# Patient Record
Sex: Female | Born: 1980 | Race: Black or African American | Hispanic: No | Marital: Single | Smoking: Current every day smoker
Health system: Southern US, Community
[De-identification: ages and names within clinical notes are randomized; demographics above are authoritative.]

## PROBLEM LIST (undated history)

## (undated) HISTORY — PX: TUBAL LIGATION: SHX77

---

## 2014-09-10 ENCOUNTER — Emergency Department (HOSPITAL_COMMUNITY): Payer: Medicaid Other

## 2014-09-10 ENCOUNTER — Encounter (HOSPITAL_COMMUNITY): Payer: Self-pay | Admitting: Emergency Medicine

## 2014-09-10 ENCOUNTER — Emergency Department (HOSPITAL_COMMUNITY)
Admission: EM | Admit: 2014-09-10 | Discharge: 2014-09-10 | Disposition: A | Discharge status: Home / Self Care | Payer: Medicaid Other | Attending: Emergency Medicine | Admitting: Emergency Medicine

## 2014-09-10 DIAGNOSIS — S99911A Unspecified injury of right ankle, initial encounter: Secondary | ICD-10-CM | POA: Diagnosis present

## 2014-09-10 DIAGNOSIS — Y998 Other external cause status: Secondary | ICD-10-CM | POA: Insufficient documentation

## 2014-09-10 DIAGNOSIS — Y9289 Other specified places as the place of occurrence of the external cause: Secondary | ICD-10-CM | POA: Insufficient documentation

## 2014-09-10 DIAGNOSIS — S93401A Sprain of unspecified ligament of right ankle, initial encounter: Secondary | ICD-10-CM | POA: Insufficient documentation

## 2014-09-10 DIAGNOSIS — Y9389 Activity, other specified: Secondary | ICD-10-CM | POA: Diagnosis not present

## 2014-09-10 DIAGNOSIS — X58XXXA Exposure to other specified factors, initial encounter: Secondary | ICD-10-CM | POA: Insufficient documentation

## 2014-09-10 DIAGNOSIS — Z720 Tobacco use: Secondary | ICD-10-CM | POA: Diagnosis not present

## 2014-09-10 MED ORDER — IBUPROFEN 600 MG PO TABS: 600.0000 mg | ORAL_TABLET | Freq: Four times a day (QID) | ORAL | Status: AC | PRN

## 2014-09-10 NOTE — ED Notes (Signed)
Twisted right ankle last pm. No deformity.

## 2014-09-10 NOTE — Discharge Instructions (Signed)
Acute Ankle Sprain with Phase II Rehab An acute ankle sprain is a partial or complete tear in one or more of the ligaments of the ankle due to traumatic injury. The severity of the injury depends on both the number of ligaments sprained and the grade of sprain. There are 3 grades of sprains.  A grade 1 sprain is a mild sprain. There is a slight pull without obvious tearing. There is no loss of strength, and the muscle and ligament are the correct length.  A grade 2 sprain is a moderate sprain. There is tearing of fibers within the substance of the ligament where it connects two bones or two cartilages. The length of the ligament is increased, and there is usually decreased strength.  A grade 3 sprain is a complete rupture of the ligament and is uncommon. In addition to the grade of sprain, there are 3 types of ankle sprains.  Lateral ankle sprains. This is a sprain of one or more of the 3 ligaments on the outer side (lateral) of the ankle. These are the most common sprains. Medial ankle sprains. There is one large triangular ligament on the inner side (medial) of the ankle that is susceptible to injury. Medial ankle sprains are less common. Syndesmosis, "high ankle," sprains. The syndesmosis is the ligament that connects the two bones of the lower leg. Syndesmosis sprains usually only occur with very severe ankle sprains. SYMPTOMS  Pain, tenderness, and swelling in the ankle, starting at the side of injury that may progress to the whole ankle and foot with time.  "Pop" or tearing sensation at the time of injury.  Bruising that may spread to the heel.  Impaired ability to walk soon after injury. CAUSES   Acute ankle sprains are caused by trauma placed on the ankle that temporarily forces or pries the anklebone (talus) out of its normal socket.  Stretching or tearing of the ligaments that normally hold the joint in place (usually due to a twisting injury). RISK INCREASES WITH:  Previous  ankle sprain.  Sports in which the foot may land awkwardly (basketball, volleyball, soccer) or walking or running on uneven or rough surfaces.  Shoes with inadequate support to prevent sideways motion when stress occurs.  Poor strength and flexibility.  Poor balance skills.  Contact sports. PREVENTION  Warm up and stretch properly before activity.  Maintain physical fitness:  Ankle and leg flexibility, muscle strength, and endurance.  Cardiovascular fitness.  Balance training activities.  Use proper technique and have a coach correct improper technique.  Taping, protective strapping, bracing, or high-top tennis shoes may help prevent injury. Initially, tape is best. However, it loses most of its support function within 10 to 15 minutes.  Wear proper fitted protective shoes. Combining high-top shoes with taping or bracing is more effective than using either alone.  Provide the ankle with support during sports and practice activities for 12 months following injury. PROGNOSIS   If treated properly, ankle sprains can be expected to recover completely. However, the length of recovery depends on the degree of injury.  A grade 1 sprain usually heals enough in 5 to 7 days to allow modified activity and requires an average of 6 weeks to heal completely.  A grade 2 sprain requires 6 to 10 weeks to heal completely.  A grade 3 sprain requires 12 to 16 weeks to heal.  A syndesmosis sprain often takes more than 3 months to heal. RELATED COMPLICATIONS   Frequent recurrence of symptoms may result   in a chronic problem. Appropriately addressing the problem the first time decreases the frequency of recurrence and optimizes healing time. Severity of initial sprain does not predict the likelihood of later instability.  Injury to other structures (bone, cartilage, or tendon).  Chronically unstable or arthritic ankle joint are possible with repeated sprains. TREATMENT Treatment initially  involves the use of ice, medicine, and compression bandages to help reduce pain and inflammation. Ankle sprains are usually immobilized in a walking cast or boot to allow for healing. Crutches may be recommended to reduce pressure on the injury. After immobilization, strengthening and stretching exercises may be necessary to regain strength and a full range of motion. Surgery is rarely needed to treat ankle sprains. MEDICATION   Nonsteroidal anti-inflammatory medicines, such as aspirin and ibuprofen (do not take for the first 3 days after injury or within 7 days before surgery), or other minor pain relievers, such as acetaminophen, are often recommended. Take these as directed by your caregiver. Contact your caregiver immediately if any bleeding, stomach upset, or signs of an allergic reaction occur from these medicines.  Ointments applied to the skin may be helpful.  Pain relievers may be prescribed as necessary by your caregiver. Do not take prescription pain medicine for longer than 4 to 7 days. Use only as directed and only as much as you need. HEAT AND COLD  Cold treatment (icing) is used to relieve pain and reduce inflammation for acute and chronic cases. Cold should be applied for 10 to 15 minutes every 2 to 3 hours for inflammation and pain and immediately after any activity that aggravates your symptoms. Use ice packs or an ice massage.  Heat treatment may be used before performing stretching and strengthening activities prescribed by your caregiver. Use a heat pack or a warm soak. SEEK IMMEDIATE MEDICAL CARE IF:   Pain, swelling, or bruising worsens despite treatment.  You experience pain, numbness, discoloration, or coldness in the foot or toes.  New, unexplained symptoms develop. (Drugs used in treatment may produce side effects.) EXERCISES  PHASE II EXERCISES RANGE OF MOTION (ROM) AND STRETCHING EXERCISES - Ankle Sprain, Acute-Phase II, Weeks 3 to 4 After your physician, physical  therapist, or athletic trainer feels your knee has made progress significant enough to begin more advanced exercises, he or she may recommend completing some of the following exercises. Although each person heals at different rates, most people will be ready for these exercises between 3 and 4 weeks after their injury. Do not begin these exercises until you have your caregiver's permission. He or she may also advise you to continue with the exercises which you completed in Phase I of your rehabilitation. While completing these exercises, remember:   Restoring tissue flexibility helps normal motion to return to the joints. This allows healthier, less painful movement and activity.  An effective stretch should be held for at least 30 seconds.  A stretch should never be painful. You should only feel a gentle lengthening or release in the stretched tissue. RANGE OF MOTION - Ankle Plantar Flexion   Sit with your right / left leg crossed over your opposite knee.  Use your opposite hand to pull the top of your foot and toes toward you.  You should feel a gentle stretch on the top of your foot/ankle. Hold this position for __________. Repeat __________ times. Complete __________ times per day.  RANGE OF MOTION - Ankle Eversion  Sit with your right / left ankle crossed over your opposite knee.    Grip your foot with your opposite hand, placing your thumb on the top of your foot and your fingers across the bottom of your foot.  Gently push your foot downward with a slight rotation so your littlest toes rise slightly  You should feel a gentle stretch on the inside of your ankle. Hold the stretch for __________ seconds. Repeat __________ times. Complete this exercise __________ times per day.  RANGE OF MOTION - Ankle Inversion  Sit with your right / left ankle crossed over your opposite knee.  Grip your foot with your opposite hand, placing your thumb on the bottom of your foot and your fingers across  the top of your foot.  Gently pull your foot so the smallest toe comes toward you and your thumb pushes the inside of the ball of your foot away from you.  You should feel a gentle stretch on the outside of your ankle. Hold the stretch for __________ seconds. Repeat __________ times. Complete this exercise __________ times per day.  STRETCH - Gastrocsoleus  Sit with your right / left leg extended. Holding onto both ends of a belt or towel, loop it around the ball of your foot.  Keeping your right / left ankle and foot relaxed and your knee straight, pull your foot and ankle toward you using the belt/towel.  You should feel a gentle stretch behind your calf or knee. Hold this position for __________ seconds. Repeat __________ times. Complete this stretch __________ times per day.  RANGE OF MOTION - Ankle Dorsiflexion, Active Assisted  Remove shoes and sit on a chair that is preferably not on a carpeted surface.  Place right / left foot under knee. Extend your opposite leg for support.  Keeping your heel down, slide your right / left foot back toward the chair until you feel a stretch at your ankle or calf. If you do not feel a stretch, slide your bottom forward to the edge of the chair while still keeping your heel down.  Hold this stretch for __________ seconds. Repeat __________ times. Complete this stretch __________ times per day.  STRETCH - Gastroc, Standing   Place hands on wall.  Extend right / left leg and place a folded washcloth under the arch of your foot for support. Keep the front knee somewhat bent.  Slightly point your toes inward on your back foot.  Keeping your right / left heel on the floor and your knee straight, shift your weight toward the wall, not allowing your back to arch.  You should feel a gentle stretch in the calf. Hold this position for __________ seconds. Repeat __________ times. Complete this stretch __________ times per day. STRETCH - Soleus,  Standing  Place hands on wall.  Extend right / left leg and place a folded washcloth under the arch of your foot for support. Keep the front knee somewhat bent.  Slightly point your toes inward on your back foot.  Keep your right / left heel on the floor, bend your back knee, and slightly shift your weight over the back leg so that you feel a gentle stretch deep in your back calf.  Hold this position for __________ seconds. Repeat __________ times. Complete this stretch __________ times per day. STRETCH - Gastrocsoleus, Standing Note: This exercise can place a lot of stress on your foot and ankle. Please complete this exercise only if specifically instructed by your caregiver.   Place the ball of your right / left foot on a step, keeping your other   foot firmly on the same step.  Hold on to the wall or a rail for balance.  Slowly lift your other foot, allowing your body weight to press your heel down over the edge of the step.  You should feel a stretch in your right / left calf.  Hold this position for __________ seconds.  Repeat this exercise with a slight bend in your knee. Repeat __________ times. Complete this stretch __________ times per day.  STRENGTHENING EXERCISES - Ankle Sprain, Acute-Phase II Around 3 to 4 weeks after your injury, you may progress to some of these exercises in your rehabilitation program. Do not begin these until you have your caregiver's permission. Although your condition has improved, the Phase I exercises will continue to be helpful and you may continue to complete them. As you complete strengthening exercises, remember:   Strong muscles with good endurance tolerate stress better.  Do the exercises as initially prescribed by your caregiver. Progress slowly with each exercise, gradually increasing the number of repetitions and weight used under his or her guidance.  You may experience muscle soreness or fatigue, but the pain or discomfort you are trying  to eliminate should never worsen during these exercises. If this pain does worsen, stop and make certain you are following the directions exactly. If the pain is still present after adjustments, discontinue the exercise until you can discuss the trouble with your caregiver. STRENGTH - Plantar-flexors, Standing  Stand with your feet shoulder width apart. Steady yourself with a wall or table using as little support as needed.  Keeping your weight evenly spread over the width of your feet, rise up on your toes.*  Hold this position for __________ seconds. Repeat __________ times. Complete this exercise __________ times per day.  *If this is too easy, shift your weight toward your right / left leg until you feel challenged. Ultimately, you may be asked to do this exercise with your right / left foot only. STRENGTH - Dorsiflexors and Plantar-flexors, Heel/toe Walking  Dorsiflexion: Walk on your heels only. Keep your toes as high as possible.  Walk for ____________________ seconds/feet.  Repeat __________ times. Complete __________ times per day.  Plantar flexion: Walk on your toes only. Keep your heels as high as possible.  Walk for ____________________ seconds/feet. Repeat __________ times. Complete __________ times per day.  BALANCE - Tandem Walking  Place your uninjured foot on a line 2 to 4 inches wide and at least 10 feet long.  Keeping your balance without using anything for extra support, place your right / left heel directly in front of your other foot.  Slowly raise your back foot up, lifting from the heel to the toes, and place it directly in front of the right / left foot.  Continue to walk along the line slowly. Walk for ____________________ feet. Repeat ____________________ times. Complete ____________________ times per day. BALANCE - Inversion/Eversion Use caution, these are advanced level exercises. Do not begin them until you are advised to do so.   Create a balance  board using a sturdy board about 1  feet long and at 1 to 1  feet wide and a 1  inch diameter rod or pipe that is as long as the board's width. A copper pipe or a solid broomstick work well.  Stand on a non-carpeted surface near a countertop or wall. Step onto the board so that your feet are hip-width apart and equally straddle the rod/pipe.  Keeping your feet in place, complete these two exercises   without shifting your upper body or hips:  Tip the board from side-to-side. Control the movement so the board does not forcefully strike the ground. The board should silently tap the ground.  Tip the board side-to-side without striking the ground. Occasionally pause and maintain a steady position at various points.  Repeat the first two exercises, but use only your right / left foot. Place your right / left foot directly over the rod/pipe. Repeat __________ times. Complete this exercise __________ times a day. BALANCE - Plantar/Dorsi Flexion Use caution, these are advanced level exercises. Do not begin them until you are advised to do so.   Create a balance board using a sturdy board about 1  feet long and at 1 to 1  feet wide and a 1  inch diameter rod or pipe that is as long as the board's width. A copper pipe or a solid broomstick work well.  Stand on a non-carpeted surface near a countertop or wall. Stand on the board so that the rod/pipe runs under the arches in your feet.  Keeping your feet in place, complete these two exercises without shifting your upper body or hips:  Tip the board from side-to-side. Control the movement so the board does not forcefully strike the ground. The board should silently tap the ground.  Tip the board side-to-side without striking the ground. Occasionally pause and maintain a steady position at various points.  Repeat the first two exercises, but use only your right / left foot. Stand in the center of the board. Repeat __________ times. Complete this  exercise __________ times a day. STRENGTH - Plantar-flexors, Eccentric Note: This exercise can place a lot of stress on your foot and ankle. Please complete this exercise only if specifically instructed by your caregiver.   Place the balls of your feet on a step. With your hands, use only enough support from a wall or rail to keep your balance.  Keep your knees straight and rise up on your toes.  Slowly shift your weight entirely to your toes and pick up your opposite foot. Gently and with controlled movement, lower your weight through your right / left foot so that your heel drops below the level of the step. You will feel a slight stretch in the back of your calf at the ending position.  Use the healthy leg to help rise up onto the balls of both feet, then lower weight only on the right / left leg again. Build up to 15 repetitions. Then progress to 3 consecutive sets of 15 repetitions.*  After completing the above exercise, complete the same exercise with a slight knee bend (about 30 degrees). Again, build up to 15 repetitions. Then progress to 3 consecutive sets of 15 repetitions.* Perform this exercise __________ times per day.  *When you easily complete 3 sets of 15, your physician, physical therapist, or athletic trainer may advise you to add resistance by wearing a backpack filled with additional weight. Document Released: 05/21/2005 Document Revised: 04/23/2011 Document Reviewed: 05/13/2008 [employer] Patient Information 2015 [employer]. This information is not intended to replace advice given to you by your health care provider. Make sure you discuss any questions you have with your health care provider.  

## 2014-09-10 NOTE — Progress Notes (Signed)
Orthopedic Tech Progress Note Patient Details:  Mia Joyce 12-15-1980 [id number]  Ortho Devices Type of Ortho Device: ASO, Crutches Ortho Device/Splint Location: rle Ortho Device/Splint Interventions: Application   Lauranne Beyersdorf 09/10/2014, 10:16 AM

## 2014-09-10 NOTE — ED Provider Notes (Signed)
CSN: [id number]     Arrival date & time 09/10/14  0905 History  This chart was scribed for non-physician practitioner, Roxy Horseman, PA-C, working with Rolland Porter, MD by Charline Bills, ED Scribe. This patient was seen in room TR07C/TR07C and the patient's care was started at 9:55 AM.   Chief Complaint  Patient presents with  . Ankle Pain   The history is provided by the patient. No language interpreter was used.   HPI Comments: Mia Joyce is a 34 y.o. female who presents to the Emergency Department with a chief complaint of right ankle injury sustained last night. Pt states that she missed a step and rolled her ankle last night. She reports a constant, throbbing sensation that is exacerbated with bearing weight and palpation. Pt is able to ambulate with a limp. She has applied ice to the area without significant relief.   History reviewed. No pertinent past medical history. Past Surgical History  Procedure Laterality Date  . Tubal ligation     No family history on file. History  Substance Use Topics  . Smoking status: Current Every Day Smoker  . Smokeless tobacco: Not on file  . Alcohol Use: Yes   OB History    No data available     Review of Systems  Constitutional: Negative for fever and chills.  Respiratory: Negative for shortness of breath.   Cardiovascular: Negative for chest pain.  Gastrointestinal: Negative for nausea, vomiting, diarrhea and constipation.  Genitourinary: Negative for dysuria.  Musculoskeletal: Positive for arthralgias.   Allergies  Review of patient's allergies indicates no known allergies.  Home Medications   Prior to Admission medications   Not on File   BP 119/65 mmHg  Pulse 103  Temp(Src) 98.1 F (36.7 C) (Oral)  Resp 18  Ht 5\' 4"  (1.626 m)  Wt 156 lb (70.761 kg)  BMI 26.76 kg/m2  SpO2 95% Physical Exam  Constitutional: She is oriented to person, place, and time. She appears well-developed and well-nourished. No distress.  HENT:   Head: Normocephalic and atraumatic.  Eyes: Conjunctivae and EOM are normal.  Neck: Neck supple. No tracheal deviation present.  Cardiovascular: Normal rate and intact distal pulses.   Good cap refill.   Pulmonary/Chest: Effort normal. No respiratory distress.  Musculoskeletal: Normal range of motion.  R ankle: Moderate Tenderness to palpation over the R ATFL and CFL. No bony abnormality or defomity. ROM and strength 5/5. Ambulates with mild antalgic gait.   Neurological: She is alert and oriented to person, place, and time. No sensory deficit.  Sensation intact.   Skin: Skin is warm and dry.  Psychiatric: She has a normal mood and affect. Her behavior is normal.  Nursing note and vitals reviewed.  ED Course  Procedures (including critical care time) DIAGNOSTIC STUDIES: Oxygen Saturation is 95% on RA, normal by my interpretation.    COORDINATION OF CARE: 9:58 AM-Discussed treatment plan which includes XR, ankle brace and follow-up with ortho if needed with pt at bedside and pt agreed to plan.   Labs Review Labs Reviewed - No data to display  Imaging Review Dg Ankle Complete Right  09/10/2014   CLINICAL DATA:  Right ankle twisting injury last night with persistent anterior pain  EXAM: RIGHT ANKLE - COMPLETE 3+ VIEW  COMPARISON:  None in PACs  FINDINGS: The ankle joint mortise is preserved. The talar dome is intact. There is no acute malleolar fracture. The talus and calcaneus are unremarkable. Mild soft tissue swelling anteriorly is noted.  IMPRESSION:  There is no acute or significant chronic bony abnormality of the right ankle.   Electronically Signed   By: David  [place] M.D.   On: 09/10/2014 09:37    EKG Interpretation None      MDM   Final diagnoses:  Ankle sprain, right, initial encounter    Patient with right ankle sprain. No bony abnormality or deformity. Will discharge with ankle brace and crutches. Recommend orthopedic follow-up. Patient is stable and ready for  discharge. I personally performed the services described in this documentation, which was scribed in my presence. The recorded information has been reviewed and is accurate.     Roxy Horseman, PA-C 09/10/14 1543  Rolland Porter, MD 09/17/14 [phone]

## 2016-04-25 IMAGING — CR DG ANKLE COMPLETE 3+V*R*
3 series · 3 of 3 positions shown · non-contrast
Comparison: None in PACs

CLINICAL DATA: Right ankle twisting injury last night with
persistent anterior pain

EXAM:
RIGHT ANKLE - COMPLETE 3+ VIEW

[ankle ap]
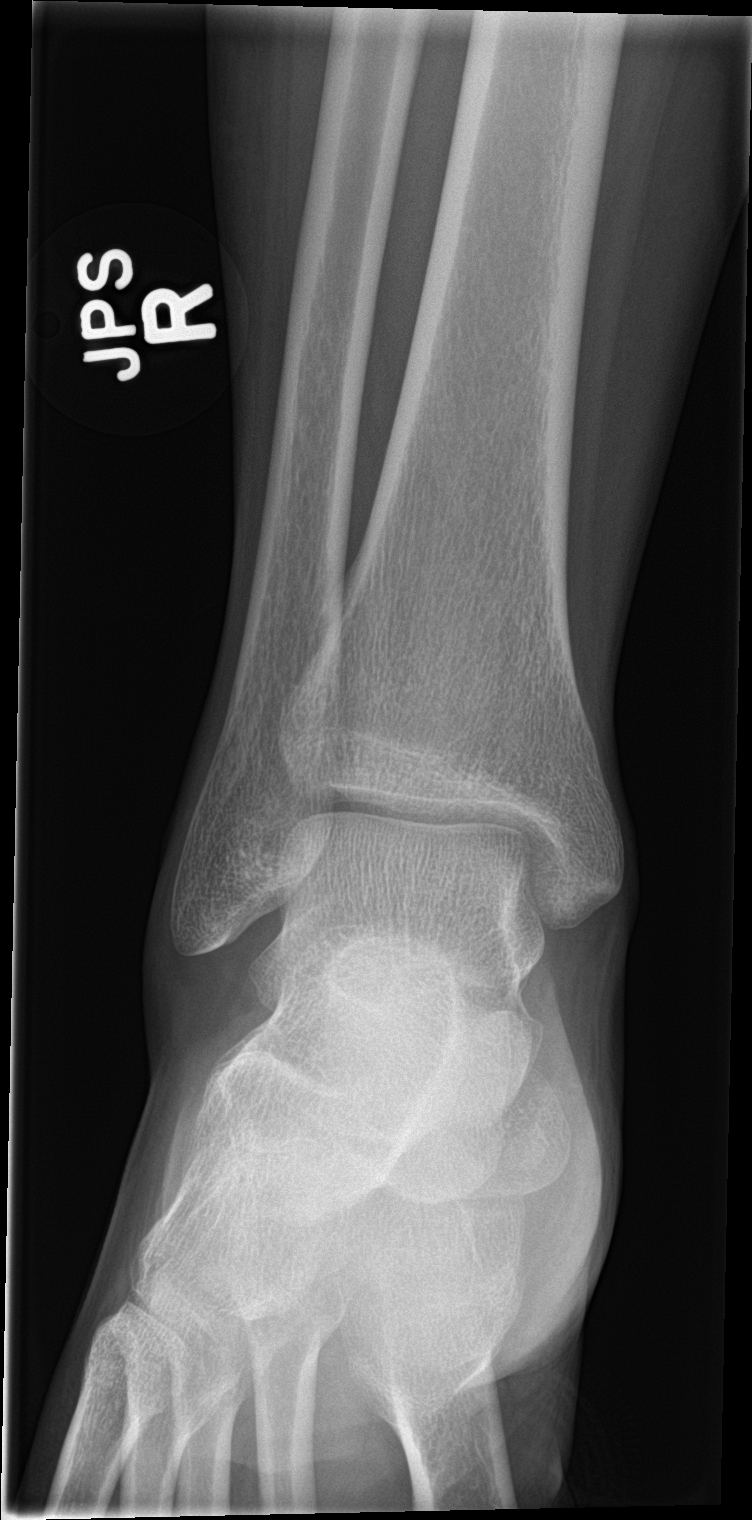

[ankle obl]
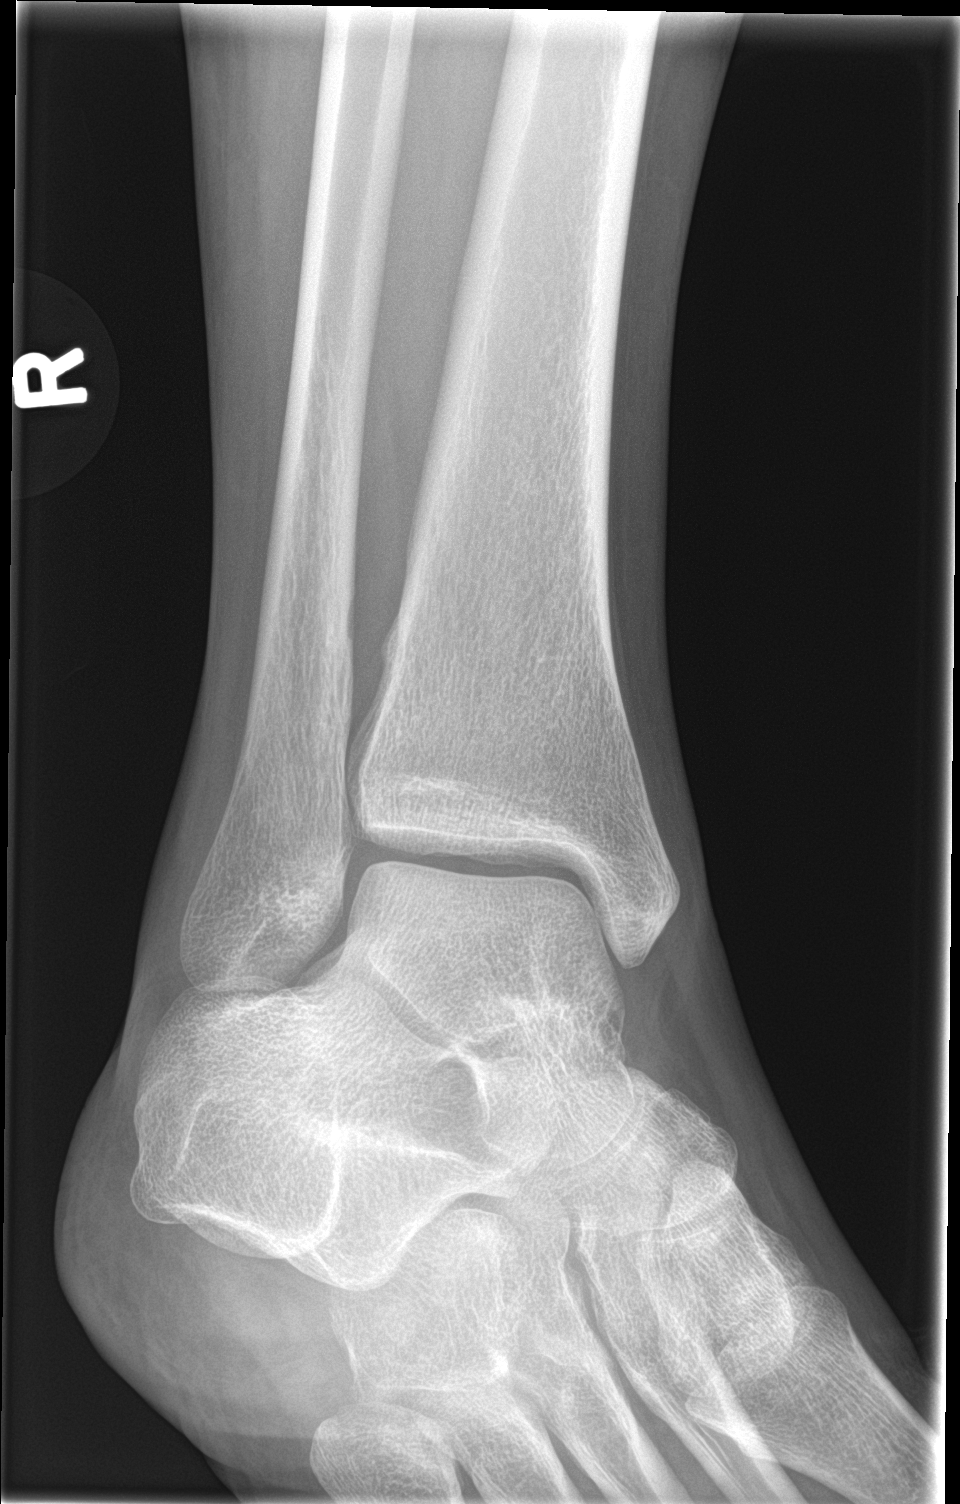

[ankle lat]
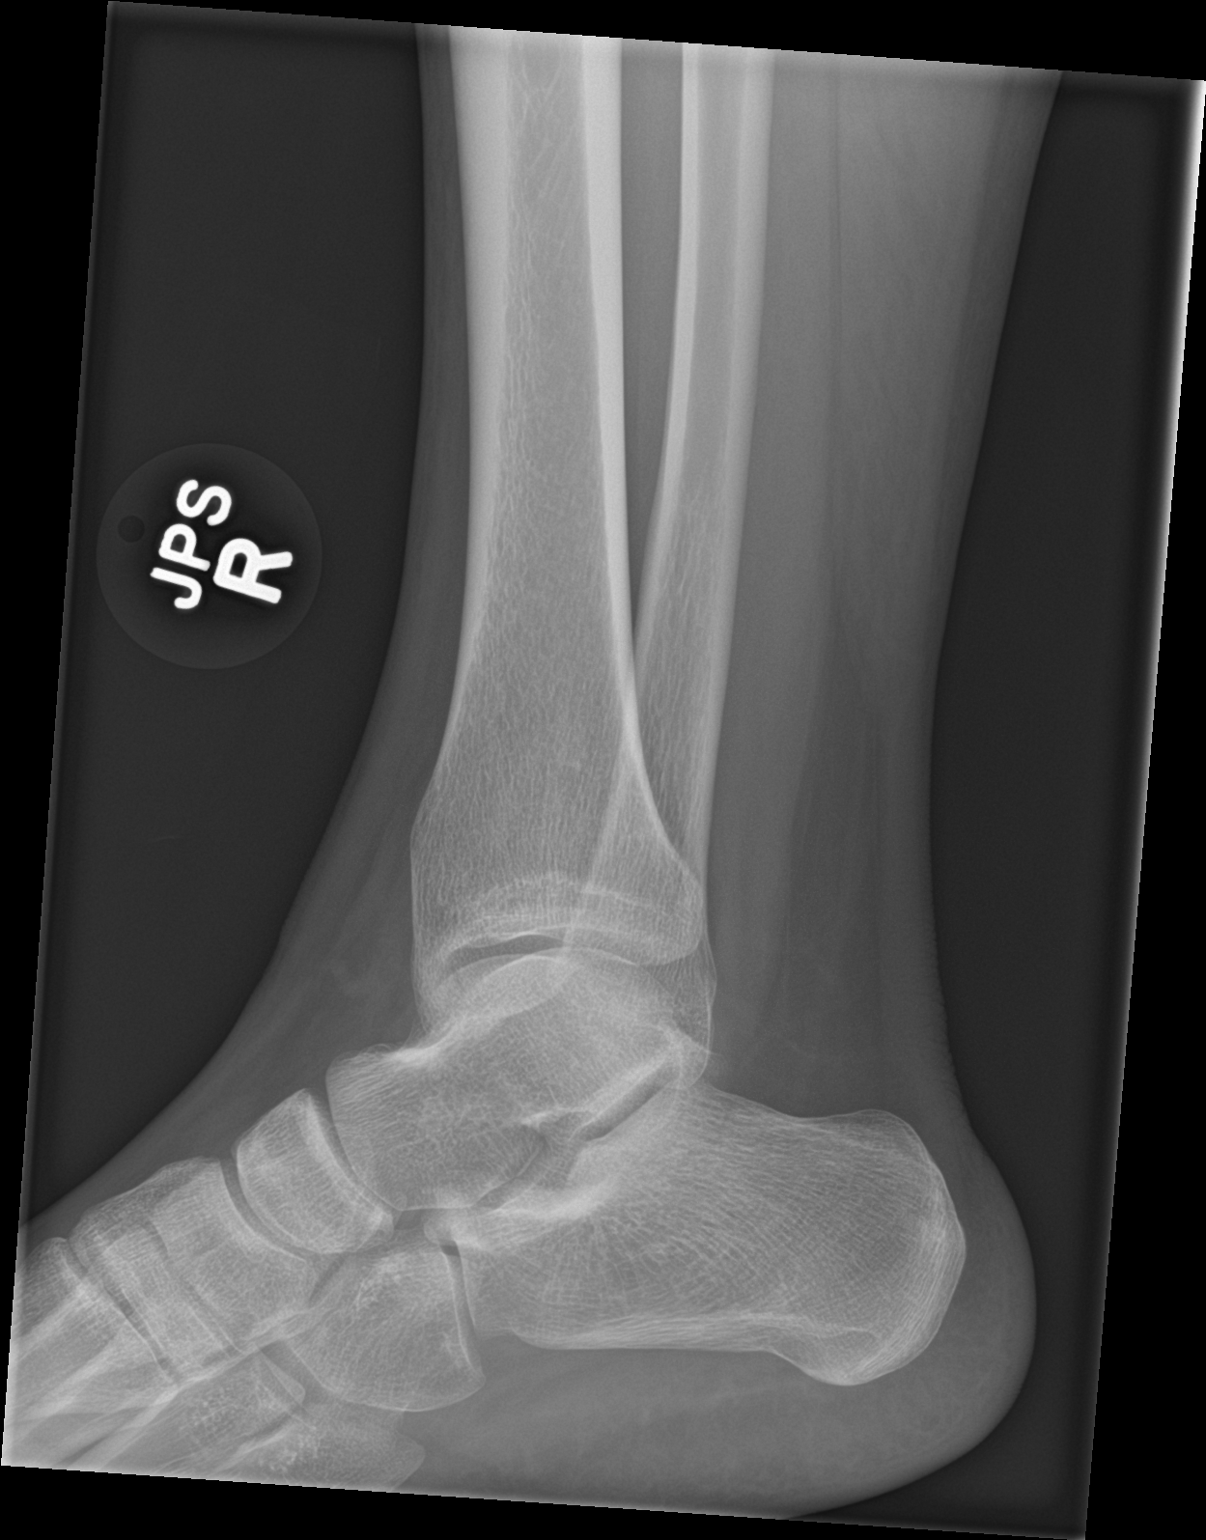

[3 of 3 positions shown; findings below may reference images not displayed]

FINDINGS: The ankle joint mortise is preserved. The talar dome is intact.
There is no acute malleolar fracture. The talus and calcaneus are
unremarkable. Mild soft tissue swelling anteriorly is noted.
IMPRESSION: There is no acute or significant chronic bony abnormality of the
right ankle.
